# Patient Record
Sex: Male | Born: 2000 | Race: White | Hispanic: No | Marital: Single | Smoking: Never smoker
Health system: Southern US, Community
[De-identification: ages and names within clinical notes are randomized; demographics above are authoritative.]

## PROBLEM LIST (undated history)

## (undated) DIAGNOSIS — K3580 Unspecified acute appendicitis: Secondary | ICD-10-CM

## (undated) HISTORY — PX: APPENDECTOMY: SHX54

---
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
## 2020-07-31 ENCOUNTER — Other Ambulatory Visit: Payer: BLUE CROSS/BLUE SHIELD
